# Patient Record
Sex: Male | Born: 1975
Health system: Southern US, Community
[De-identification: ages and names within clinical notes are randomized; demographics above are authoritative.]

## PROBLEM LIST (undated history)

## (undated) HISTORY — PX: KNEE CARTILAGE SURGERY: SHX688

## (undated) HISTORY — PX: APPENDECTOMY: SHX54

## (undated) HISTORY — PX: THUMB FUSION: SUR636

---

## 1998-06-14 ENCOUNTER — Encounter: Payer: Self-pay | Admitting: Emergency Medicine

## 1998-06-14 ENCOUNTER — Emergency Department (HOSPITAL_COMMUNITY): Admission: EM | Admit: 1998-06-14 | Discharge: 1998-06-15 | Payer: Self-pay | Admitting: Emergency Medicine

## 1998-06-15 ENCOUNTER — Encounter: Payer: Self-pay | Admitting: Emergency Medicine

## 1999-01-20 ENCOUNTER — Emergency Department (HOSPITAL_COMMUNITY): Admission: EM | Admit: 1999-01-20 | Discharge: 1999-01-20 | Payer: Self-pay | Admitting: *Deleted

## 2013-06-22 ENCOUNTER — Other Ambulatory Visit: Payer: Self-pay | Admitting: Occupational Medicine

## 2013-06-22 ENCOUNTER — Ambulatory Visit: Payer: Self-pay

## 2013-06-22 DIAGNOSIS — M79641 Pain in right hand: Secondary | ICD-10-CM

## 2013-06-22 DIAGNOSIS — R52 Pain, unspecified: Secondary | ICD-10-CM

## 2015-09-16 ENCOUNTER — Other Ambulatory Visit: Payer: Self-pay | Admitting: Orthopedic Surgery

## 2015-09-16 DIAGNOSIS — R52 Pain, unspecified: Secondary | ICD-10-CM

## 2015-09-25 ENCOUNTER — Ambulatory Visit
Admission: RE | Admit: 2015-09-25 | Discharge: 2015-09-25 | Disposition: A | Discharge status: Home / Self Care | Payer: Worker's Compensation | Source: Ambulatory Visit | Attending: Orthopedic Surgery | Admitting: Orthopedic Surgery

## 2015-09-25 DIAGNOSIS — R52 Pain, unspecified: Secondary | ICD-10-CM

## 2015-09-25 MED ORDER — IOPAMIDOL (ISOVUE-M 200) INJECTION 41%
1.5000 mL | Freq: Once | INTRAMUSCULAR | Status: AC
  Administered 2015-09-25: 1.5 mL via INTRA_ARTICULAR

## 2015-10-07 ENCOUNTER — Other Ambulatory Visit: Payer: Self-pay | Admitting: Orthopedic Surgery

## 2015-10-07 DIAGNOSIS — M79644 Pain in right finger(s): Secondary | ICD-10-CM

## 2015-10-14 ENCOUNTER — Ambulatory Visit
Admission: RE | Admit: 2015-10-14 | Discharge: 2015-10-14 | Disposition: A | Discharge status: Home / Self Care | Payer: Worker's Compensation | Source: Ambulatory Visit | Attending: Orthopedic Surgery | Admitting: Orthopedic Surgery

## 2015-10-14 DIAGNOSIS — M79644 Pain in right finger(s): Secondary | ICD-10-CM

## 2015-10-14 MED ORDER — IOPAMIDOL (ISOVUE-M 200) INJECTION 41%
1.0000 mL | Freq: Once | INTRAMUSCULAR | Status: AC
  Administered 2015-10-14: 1 mL via INTRA_ARTICULAR

## 2016-02-27 ENCOUNTER — Other Ambulatory Visit (HOSPITAL_COMMUNITY): Payer: Self-pay | Admitting: Orthopedic Surgery

## 2016-02-27 DIAGNOSIS — M189 Osteoarthritis of first carpometacarpal joint, unspecified: Secondary | ICD-10-CM

## 2016-03-09 ENCOUNTER — Ambulatory Visit (HOSPITAL_COMMUNITY)
Admission: RE | Admit: 2016-03-09 | Discharge: 2016-03-09 | Disposition: A | Discharge status: Home / Self Care | Payer: Worker's Compensation | Source: Ambulatory Visit | Attending: Orthopedic Surgery | Admitting: Orthopedic Surgery

## 2016-03-09 ENCOUNTER — Encounter (HOSPITAL_COMMUNITY)
Admission: RE | Admit: 2016-03-09 | Discharge: 2016-03-09 | Disposition: A | Discharge status: Home / Self Care | Payer: Worker's Compensation | Source: Ambulatory Visit | Attending: Orthopedic Surgery | Admitting: Orthopedic Surgery

## 2016-03-09 DIAGNOSIS — M189 Osteoarthritis of first carpometacarpal joint, unspecified: Secondary | ICD-10-CM

## 2016-03-09 MED ORDER — TECHNETIUM TC 99M MEDRONATE IV KIT
20.2000 | PACK | Freq: Once | INTRAVENOUS | Status: AC | PRN
  Administered 2016-03-09: 20.2 via INTRAVENOUS

## 2016-10-27 ENCOUNTER — Emergency Department (HOSPITAL_COMMUNITY): Payer: 59

## 2016-10-27 ENCOUNTER — Emergency Department (HOSPITAL_COMMUNITY)
Admission: EM | Admit: 2016-10-27 | Discharge: 2016-10-27 | Disposition: A | Discharge status: Home / Self Care | Payer: 59 | Attending: Emergency Medicine | Admitting: Emergency Medicine

## 2016-10-27 DIAGNOSIS — R079 Chest pain, unspecified: Secondary | ICD-10-CM | POA: Diagnosis not present

## 2016-10-27 DIAGNOSIS — R0789 Other chest pain: Secondary | ICD-10-CM | POA: Diagnosis not present

## 2016-10-27 LAB — BASIC METABOLIC PANEL
ANION GAP: 8 (ref 5–15)
BUN: 15 mg/dL (ref 6–20)
CALCIUM: 9.6 mg/dL (ref 8.9–10.3)
CO2: 24 mmol/L (ref 22–32)
Chloride: 108 mmol/L (ref 101–111)
Creatinine, Ser: 1.47 mg/dL — ABNORMAL HIGH (ref 0.61–1.24)
GFR calc Af Amer: >60 mL/min (ref >60)
GFR, EST NON AFRICAN AMERICAN: 58 mL/min — AB (ref >60)
Glucose, Bld: 93 mg/dL (ref 65–99)
Potassium: 4 mmol/L (ref 3.5–5.1)
SODIUM: 140 mmol/L (ref 135–145)

## 2016-10-27 LAB — CBC
HCT: 47.6 % (ref 39.0–52.0)
Hemoglobin: 15.7 g/dL (ref 13.0–17.0)
MCH: 28.6 pg (ref 26.0–34.0)
MCHC: 33 g/dL (ref 30.0–36.0)
MCV: 86.9 fL (ref 78.0–100.0)
PLATELETS: 248 10*3/uL (ref 150–400)
RBC: 5.48 MIL/uL (ref 4.22–5.81)
RDW: 13.5 % (ref 11.5–15.5)
WBC: 8.4 10*3/uL (ref 4.0–10.5)

## 2016-10-27 LAB — I-STAT TROPONIN, ED
TROPONIN I, POC: 0.02 ng/mL (ref 0.00–0.08)
TROPONIN I, POC: 0.02 ng/mL (ref 0.00–0.08)

## 2016-10-27 MED ORDER — ASPIRIN 81 MG PO CHEW: 324.0000 mg | CHEWABLE_TABLET | Freq: Once | ORAL | Status: DC

## 2016-10-27 NOTE — ED Triage Notes (Signed)
Pt BIB [facility]. Pt reports he was out running today, which he does normally, and had a tight discomfort in his chest. He reports at worst it was 7/10. Already diaphoretic r/t running, deneis SOB, denies NV. Pt states he went home and tried to shower and drink some water but still had discomfort. Went to fire department and called 911. Took 3 Ibuprofen today for dental work yesterday. Given 324 ASA PTA. No Nitro given, pt was pain free for EMS.

## 2016-10-27 NOTE — ED Provider Notes (Signed)
[facility]-EMERGENCY DEPT Provider Note   CSN: [id number] Arrival date & time: 10/27/16  1739     History   Chief Complaint Chief Complaint  Patient presents with  . Chest Pain    HPI Manuel Taylor is a 41 y.o. male.  HPI  Patient is a 41 year old male with no significant past medical history who presents to the emergency department with chest pain. Patient was running approximately 2 miles earlier today which she states that he does on a daily basis. Towards the end of his run he started having substernal chest pressure, nonradiating, dull. No associated shortness of breath, nausea, vomiting, diaphoresis. No syncope or lightheadedness. States he went home, took a shower and rested with only mild improvement of his chest pressure. Worsens when he was swallowing water. Nothing improved. Received aspirin with EMS. States that by the time he got to the emergency department he was no longer having any chest pain. No chronic NSAID or alcohol use. Has been taking ibuprofen over the past couple of days for dental filling. Denies abdominal pain or dysuria. Her tobacco use. No prior history of PE/DVT. No recent long trips or surgery.  No past medical history on file.  There are no active problems to display for this patient.   No past surgical history on file.     Home Medications    Prior to Admission medications   Medication Sig Start Date End Date Taking? Authorizing Provider  ibuprofen (ADVIL,MOTRIN) 200 MG tablet Take 600 mg by mouth every 6 (six) hours as needed for moderate pain.    Yes [provider]    Family History No family history on file.  Social History Social History  Substance Use Topics  . Smoking status: Not on file  . Smokeless tobacco: Not on file  . Alcohol use Not on file     Allergies   Patient has no allergy information on record.   Review of Systems Review of Systems  Constitutional: Negative for appetite change and fever.  HENT: Negative  for congestion.   Eyes: Negative for visual disturbance.  Respiratory: Positive for chest tightness. Negative for shortness of breath.   Cardiovascular: Negative for chest pain and palpitations.  Gastrointestinal: Negative for abdominal pain, blood in stool, nausea and vomiting.  Genitourinary: Negative for decreased urine volume and dysuria.  Musculoskeletal: Negative for back pain.  Skin: Negative for rash.  Neurological: Negative for dizziness, seizures, weakness, light-headedness and headaches.  Psychiatric/Behavioral: Negative for behavioral problems.     Physical Exam Updated Vital Signs BP (!) 142/88   Pulse (!) 52   Temp 98.6 F (37 C) (Oral)   Resp 14   SpO2 97%   Physical Exam  Constitutional: He is oriented to person, place, and time. He appears well-developed and well-nourished.  HENT:  Head: Atraumatic.  Mouth/Throat: Oropharynx is clear and moist.  Eyes: Conjunctivae and EOM are normal.  Neck: Normal range of motion. No JVD present.  Cardiovascular: Normal rate, regular rhythm, normal heart sounds and intact distal pulses.   Pulmonary/Chest: Effort normal and breath sounds normal. No respiratory distress. He has no wheezes. He exhibits no tenderness.  Abdominal: Soft. He exhibits no distension. There is no tenderness.  Musculoskeletal: Normal range of motion.  Neurological: He is alert and oriented to person, place, and time.  Skin: Skin is warm. Capillary refill takes less than 2 seconds.  Psychiatric: He has a normal mood and affect.     ED Treatments / Results  Labs (  all labs ordered are listed, but only abnormal results are displayed) Labs Reviewed  BASIC METABOLIC PANEL - Abnormal; Notable for the following:       Result Value   Creatinine, Ser 1.47 (*)    GFR calc non Af Amer 58 (*)    All other components within normal limits  CBC  CBG MONITORING, ED  I-STAT TROPOININ, ED  I-STAT TROPOININ, ED    EKG  EKG  Interpretation  Date/Time:  Wednesday October 27 2016 18:03:26 EDT Ventricular Rate:  58 PR Interval:    QRS Duration: 109 QT Interval:  456 QTC Calculation: 448 R Axis:   47 Text Interpretation:  Sinus rhythm Borderline T abnormalities, lateral leads Minimal ST elevation, anterior leads No old tracing to compare Confirmed by Melene PlanFloyd, Dan [phone]([id number]) on 10/27/2016 6:12:35 PM       Radiology Dg Chest 2 View  Result Date: 10/27/2016 CLINICAL DATA:  41 year old male with chest pain which began after running earlier today EXAM: CHEST  2 VIEW COMPARISON:  None. FINDINGS: The lungs are clear and negative for focal airspace consolidation, pulmonary edema or suspicious pulmonary nodule. No pleural effusion or pneumothorax. Cardiac and mediastinal contours are within normal limits. No acute fracture or lytic or blastic osseous lesions. The visualized upper abdominal bowel gas pattern is unremarkable. IMPRESSION: Normal chest x-ray. Electronically Signed   By: Malachy MoanHeath  McCullough M.D.   On: 10/27/2016 18:51    Procedures Procedures (including critical care time)  Medications Ordered in ED Medications  aspirin chewable tablet 324 mg (324 mg Oral Not Given 10/27/16 1810)     Initial Impression / Assessment and Plan / ED Course  I have reviewed the triage vital signs and the nursing notes.  Pertinent labs & imaging results that were available during my care of the patient were reviewed by me and considered in my medical decision making (see chart for details).     41 year old with no significant past medical history who presents with chest pain. Resolved prior to arrival. Aspirin 325 with EMS. Afebrile, hemodynamically stable. Lungs clear to auscultation, no murmur, symmetric pulses, benign abdominal exam.  EKG Showed nonspecific T-wave changes of the lateral leads, normal intervals, normal sinus rhythm, no signs of chamber enlargement. No prior EKG to compare. Chest x-ray showed no acute findings. No  significant electrolyte abnormalities. No leukocytosis.  Doubt ACS, low risk HEART score, delta troponin negative. Doubt PE, PERC negative. Doubt pericarditis, currently chest pain-free, not positional, no findings on EKG. Possibly gastritis/PUD. Patient without any signs or symptoms of perforation. Remains chest pain-free while in the emergency department.  Stable for discharge home. Discussed no strenuous exercise until is able to follow up with his primary care physician. Given strict return impressions to the emergency department. Patient expressed understanding, no questions or concerns at time of discharge.  Final Clinical Impressions(s) / ED Diagnoses   Final diagnoses:  Chest pain, unspecified type    New Prescriptions Discharge Medication List as of 10/27/2016  9:24 PM       Corena HerterMumma, Marine Lezotte, MD 10/27/16 2256    Melene PlanFloyd, Dan, DO 10/27/16 2306

## 2016-10-27 NOTE — ED Notes (Signed)
Patient transported to X-ray 

## 2016-11-19 DIAGNOSIS — Z13220 Encounter for screening for lipoid disorders: Secondary | ICD-10-CM | POA: Diagnosis not present

## 2016-11-19 DIAGNOSIS — Z0000 Encounter for general adult medical examination without abnormal findings: Secondary | ICD-10-CM | POA: Diagnosis not present

## 2017-01-07 IMAGING — XA DG FLUORO GUIDE NDL PLC/BX
1 series · 1 of 1 positions shown · non-contrast
Comparison: none

CLINICAL DATA: RIGHT hand pain. RIGHT thumb pain. Megumi
compensation injury.

[Series 2: ortho extremity · 1 of 1 slices shown]
[im 1/1]
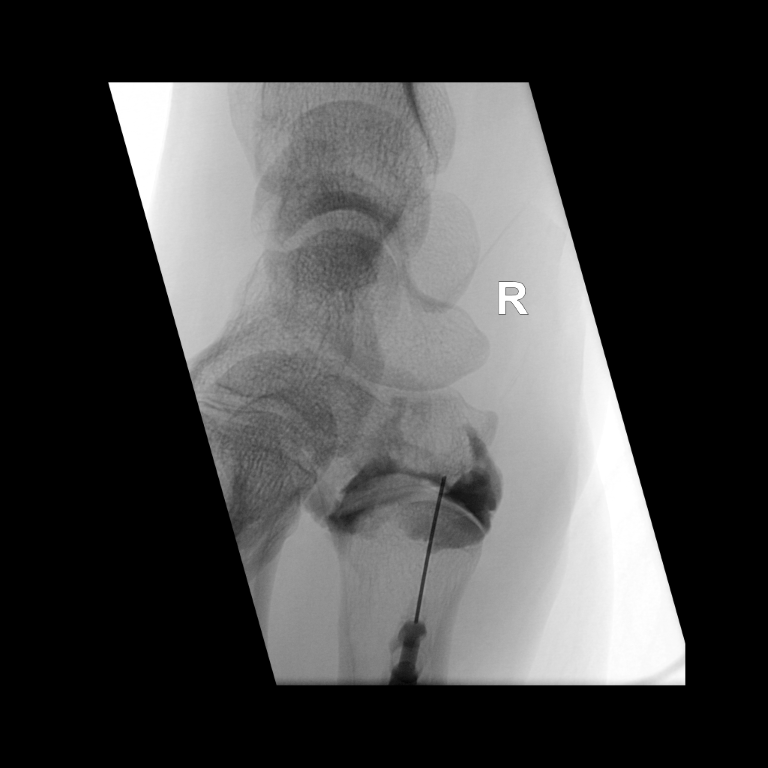

[1 of 1 positions shown; findings below may reference images not displayed]

FLUOROSCOPY TIME:  54 seconds corresponding to a Dose Area Product
of 6.91 ?Gy*m2

PROCEDURE:
RIGHT FIRST CARPOMETACARPAL JOINT  INJECTION UNDER FLUOROSCOPY

The patient was here 3 weeks ago for MR arthrography, and the MCP
joint instead of the CMC joint was injected. Today's study is a
follow-up to that encounter.

An appropriate skin entrance site was determined. The site was
marked, prepped with Betadine, draped in the usual sterile fashion,
and infiltrated locally with 1% lidocaine. 25 gauge hypodermic
needle was placed in the joint from a dorsal lateral approach A
mixture of 0.1 ml Multihance and 20 ml of dilute Isovue M 200 was
used to opacify the joint with 1 mL of contrast mixture. No
immediate complication.
IMPRESSION: Technically successful RIGHT first CMC joint injection for MR
arthrography.

## 2017-11-21 DIAGNOSIS — Z8042 Family history of malignant neoplasm of prostate: Secondary | ICD-10-CM | POA: Diagnosis not present

## 2017-11-21 DIAGNOSIS — Z13220 Encounter for screening for lipoid disorders: Secondary | ICD-10-CM | POA: Diagnosis not present

## 2017-11-21 DIAGNOSIS — Z0000 Encounter for general adult medical examination without abnormal findings: Secondary | ICD-10-CM | POA: Diagnosis not present

## 2018-02-23 DIAGNOSIS — R7309 Other abnormal glucose: Secondary | ICD-10-CM | POA: Diagnosis not present

## 2018-08-07 ENCOUNTER — Emergency Department (HOSPITAL_COMMUNITY): Payer: 59

## 2018-08-07 ENCOUNTER — Emergency Department (HOSPITAL_COMMUNITY): Payer: 59 | Admitting: Certified Registered"

## 2018-08-07 ENCOUNTER — Encounter (HOSPITAL_COMMUNITY)
Admission: EM | Disposition: A | Discharge status: Home / Self Care | Payer: Self-pay | Source: Home / Self Care | Attending: Emergency Medicine

## 2018-08-07 ENCOUNTER — Other Ambulatory Visit: Payer: Self-pay

## 2018-08-07 ENCOUNTER — Ambulatory Visit (HOSPITAL_COMMUNITY)
Admission: EM | Admit: 2018-08-07 | Discharge: 2018-08-07 | Disposition: A | Discharge status: Home / Self Care | Payer: 59 | Attending: Emergency Medicine | Admitting: Emergency Medicine

## 2018-08-07 ENCOUNTER — Ambulatory Visit: Admit: 2018-08-07 | Payer: 59 | Admitting: General Surgery

## 2018-08-07 ENCOUNTER — Encounter (HOSPITAL_COMMUNITY): Payer: Self-pay | Admitting: *Deleted

## 2018-08-07 DIAGNOSIS — W3189XA Contact with other specified machinery, initial encounter: Secondary | ICD-10-CM | POA: Insufficient documentation

## 2018-08-07 DIAGNOSIS — S6992XA Unspecified injury of left wrist, hand and finger(s), initial encounter: Secondary | ICD-10-CM | POA: Diagnosis present

## 2018-08-07 DIAGNOSIS — S68623A Partial traumatic transphalangeal amputation of left middle finger, initial encounter: Secondary | ICD-10-CM | POA: Diagnosis not present

## 2018-08-07 DIAGNOSIS — Z87891 Personal history of nicotine dependence: Secondary | ICD-10-CM | POA: Diagnosis not present

## 2018-08-07 DIAGNOSIS — S68119A Complete traumatic metacarpophalangeal amputation of unspecified finger, initial encounter: Secondary | ICD-10-CM

## 2018-08-07 HISTORY — PX: AMPUTATION: SHX166

## 2018-08-07 LAB — BASIC METABOLIC PANEL
Anion gap: 8 (ref 5–15)
BUN: 12 mg/dL (ref 6–20)
CO2: 25 mmol/L (ref 22–32)
Calcium: 9.6 mg/dL (ref 8.9–10.3)
Chloride: 110 mmol/L (ref 98–111)
Creatinine, Ser: 1.1 mg/dL (ref 0.61–1.24)
GFR calc Af Amer: >60 mL/min (ref >60)
GFR calc non Af Amer: >60 mL/min (ref >60)
Glucose, Bld: 110 mg/dL — ABNORMAL HIGH (ref 70–99)
Potassium: 4.3 mmol/L (ref 3.5–5.1)
Sodium: 143 mmol/L (ref 135–145)

## 2018-08-07 LAB — CBC WITH DIFFERENTIAL/PLATELET
Abs Immature Granulocytes: 0 10*3/uL (ref 0.00–0.07)
Basophils Absolute: 0 10*3/uL (ref 0.0–0.1)
Basophils Relative: 1 %
Eosinophils Absolute: 0.1 10*3/uL (ref 0.0–0.5)
Eosinophils Relative: 1 %
HCT: 51.2 % (ref 39.0–52.0)
Hemoglobin: 16.3 g/dL (ref 13.0–17.0)
Immature Granulocytes: 0 %
Lymphocytes Relative: 39 %
Lymphs Abs: 1.9 10*3/uL (ref 0.7–4.0)
MCH: 28.4 pg (ref 26.0–34.0)
MCHC: 31.8 g/dL (ref 30.0–36.0)
MCV: 89.2 fL (ref 80.0–100.0)
Monocytes Absolute: 0.3 10*3/uL (ref 0.1–1.0)
Monocytes Relative: 7 %
Neutro Abs: 2.6 10*3/uL (ref 1.7–7.7)
Neutrophils Relative %: 52 %
Platelets: 222 10*3/uL (ref 150–400)
RBC: 5.74 MIL/uL (ref 4.22–5.81)
RDW: 12.4 % (ref 11.5–15.5)
WBC: 5 10*3/uL (ref 4.0–10.5)
nRBC: 0 % (ref 0.0–0.2)

## 2018-08-07 LAB — PROTIME-INR
INR: 1.2 (ref 0.8–1.2)
Prothrombin Time: 15.1 seconds (ref 11.4–15.2)

## 2018-08-07 SURGERY — AMPUTATION DIGIT
Anesthesia: Monitor Anesthesia Care | Laterality: Left

## 2018-08-07 MED ORDER — CEFAZOLIN SODIUM-DEXTROSE 1-4 GM/50ML-% IV SOLN
1.0000 g | Freq: Once | INTRAVENOUS | Status: AC
  Administered 2018-08-07: 1 g via INTRAVENOUS
  Filled 2018-08-07: qty 50

## 2018-08-07 MED ORDER — 0.9 % SODIUM CHLORIDE (POUR BTL) OPTIME
TOPICAL | Status: DC | PRN
  Administered 2018-08-07: 1000 mL

## 2018-08-07 MED ORDER — OXYCODONE HCL 5 MG/5ML PO SOLN: 5.0000 mg | Freq: Once | ORAL | Status: AC | PRN

## 2018-08-07 MED ORDER — MIDAZOLAM HCL 2 MG/2ML IJ SOLN
INTRAMUSCULAR | Status: AC
  Filled 2018-08-07: qty 2

## 2018-08-07 MED ORDER — SODIUM CHLORIDE 0.9 % IV BOLUS
500.0000 mL | Freq: Once | INTRAVENOUS | Status: DC
Start: 2018-08-07 — End: 2018-08-07

## 2018-08-07 MED ORDER — BUPIVACAINE HCL (PF) 0.25 % IJ SOLN
INTRAMUSCULAR | Status: DC | PRN
  Administered 2018-08-07: 10 mL

## 2018-08-07 MED ORDER — LIDOCAINE 2% (20 MG/ML) 5 ML SYRINGE
INTRAMUSCULAR | Status: AC
  Filled 2018-08-07: qty 5

## 2018-08-07 MED ORDER — CEPHALEXIN 500 MG PO CAPS: 500.0000 mg | ORAL_CAPSULE | Freq: Four times a day (QID) | ORAL | 0 refills | Status: DC

## 2018-08-07 MED ORDER — HYDROCODONE-ACETAMINOPHEN 5-325 MG PO TABS: 1.0000 | ORAL_TABLET | ORAL | 0 refills | Status: AC | PRN

## 2018-08-07 MED ORDER — ONDANSETRON HCL 4 MG/2ML IJ SOLN: 4.0000 mg | Freq: Four times a day (QID) | INTRAMUSCULAR | Status: DC | PRN

## 2018-08-07 MED ORDER — CHLORHEXIDINE GLUCONATE 4 % EX LIQD
60.0000 mL | Freq: Once | CUTANEOUS | Status: DC
  Filled 2018-08-07: qty 60

## 2018-08-07 MED ORDER — ONDANSETRON HCL 4 MG/2ML IJ SOLN
INTRAMUSCULAR | Status: DC | PRN
  Administered 2018-08-07: 4 mg via INTRAVENOUS

## 2018-08-07 MED ORDER — MIDAZOLAM HCL 5 MG/5ML IJ SOLN
INTRAMUSCULAR | Status: DC | PRN
  Administered 2018-08-07: 2 mg via INTRAVENOUS

## 2018-08-07 MED ORDER — OXYCODONE HCL 5 MG PO TABS
5.0000 mg | ORAL_TABLET | Freq: Once | ORAL | Status: AC | PRN
  Administered 2018-08-07: 5 mg via ORAL

## 2018-08-07 MED ORDER — BUPIVACAINE HCL (PF) 0.25 % IJ SOLN
INTRAMUSCULAR | Status: AC
  Filled 2018-08-07: qty 30

## 2018-08-07 MED ORDER — PROPOFOL 500 MG/50ML IV EMUL
INTRAVENOUS | Status: DC | PRN
  Administered 2018-08-07: 100 ug/kg/min via INTRAVENOUS

## 2018-08-07 MED ORDER — BUPIVACAINE HCL (PF) 0.5 % IJ SOLN
10.0000 mL | Freq: Once | INTRAMUSCULAR | Status: AC
  Administered 2018-08-07: 10 mL
  Filled 2018-08-07: qty 10

## 2018-08-07 MED ORDER — LACTATED RINGERS IV SOLN
INTRAVENOUS | Status: DC
  Administered 2018-08-07: 17:00:00 via INTRAVENOUS

## 2018-08-07 MED ORDER — FENTANYL CITRATE (PF) 100 MCG/2ML IJ SOLN: 25.0000 ug | INTRAMUSCULAR | Status: DC | PRN

## 2018-08-07 MED ORDER — OXYCODONE HCL 5 MG PO TABS
ORAL_TABLET | ORAL | Status: AC
  Filled 2018-08-07: qty 1

## 2018-08-07 MED ORDER — LIDOCAINE 2% (20 MG/ML) 5 ML SYRINGE
INTRAMUSCULAR | Status: DC | PRN
  Administered 2018-08-07: 60 mg via INTRAVENOUS

## 2018-08-07 MED ORDER — FENTANYL CITRATE (PF) 250 MCG/5ML IJ SOLN
INTRAMUSCULAR | Status: AC
  Filled 2018-08-07: qty 5

## 2018-08-07 MED ORDER — DEXAMETHASONE SODIUM PHOSPHATE 10 MG/ML IJ SOLN
INTRAMUSCULAR | Status: DC | PRN
  Administered 2018-08-07: 4 mg via INTRAVENOUS

## 2018-08-07 MED ORDER — FENTANYL CITRATE (PF) 250 MCG/5ML IJ SOLN
INTRAMUSCULAR | Status: DC | PRN
  Administered 2018-08-07: 100 ug via INTRAVENOUS

## 2018-08-07 MED ORDER — POVIDONE-IODINE 10 % EX SWAB: 2.0000 "application " | Freq: Once | CUTANEOUS | Status: DC

## 2018-08-07 SURGICAL SUPPLY — 35 items
BANDAGE ACE 4X5 VEL STRL LF (GAUZE/BANDAGES/DRESSINGS) ×3 IMPLANT
BANDAGE COBAN LF 1.5X5 NS (GAUZE/BANDAGES/DRESSINGS) ×2 IMPLANT
BNDG CMPR 9X4 STRL LF SNTH (GAUZE/BANDAGES/DRESSINGS) ×1
BNDG ESMARK 4X9 LF (GAUZE/BANDAGES/DRESSINGS) ×3 IMPLANT
BNDG GAUZE ELAST 4 BULKY (GAUZE/BANDAGES/DRESSINGS) ×3 IMPLANT
CHLORAPREP W/TINT 26ML (MISCELLANEOUS) ×3 IMPLANT
CORDS BIPOLAR (ELECTRODE) ×3 IMPLANT
COVER WAND RF STERILE (DRAPES) ×3 IMPLANT
DRSG ADAPTIC 3X8 NADH LF (GAUZE/BANDAGES/DRESSINGS) ×3 IMPLANT
DRSG XEROFORM 1X8 (GAUZE/BANDAGES/DRESSINGS) ×2 IMPLANT
ELECT REM PT RETURN 9FT ADLT (ELECTROSURGICAL)
ELECTRODE REM PT RTRN 9FT ADLT (ELECTROSURGICAL) IMPLANT
GAUZE SPONGE 4X4 12PLY STRL (GAUZE/BANDAGES/DRESSINGS) ×3 IMPLANT
GAUZE XEROFORM 1X8 LF (GAUZE/BANDAGES/DRESSINGS) ×3 IMPLANT
GLOVE BIOGEL M 8.0 STRL (GLOVE) ×3 IMPLANT
GOWN STRL REUS W/ TWL LRG LVL3 (GOWN DISPOSABLE) ×1 IMPLANT
GOWN STRL REUS W/ TWL XL LVL3 (GOWN DISPOSABLE) ×1 IMPLANT
GOWN STRL REUS W/TWL LRG LVL3 (GOWN DISPOSABLE) ×3
GOWN STRL REUS W/TWL XL LVL3 (GOWN DISPOSABLE) ×3
KIT BASIN OR (CUSTOM PROCEDURE TRAY) ×3 IMPLANT
KIT TURNOVER KIT B (KITS) ×3 IMPLANT
NS IRRIG 1000ML POUR BTL (IV SOLUTION) ×3 IMPLANT
PACK ORTHO EXTREMITY (CUSTOM PROCEDURE TRAY) ×3 IMPLANT
PAD ARMBOARD 7.5X6 YLW CONV (MISCELLANEOUS) ×6 IMPLANT
PAD CAST 4YDX4 CTTN HI CHSV (CAST SUPPLIES) ×1 IMPLANT
PADDING CAST COTTON 4X4 STRL (CAST SUPPLIES) ×3
SOAP 2 % CHG 4 OZ (WOUND CARE) ×3 IMPLANT
SPONGE LAP 18X18 RF (DISPOSABLE) ×3 IMPLANT
SPONGE LAP 4X18 RFD (DISPOSABLE) ×3 IMPLANT
TOWEL OR 17X24 6PK STRL BLUE (TOWEL DISPOSABLE) ×3 IMPLANT
TOWEL OR 17X26 10 PK STRL BLUE (TOWEL DISPOSABLE) ×3 IMPLANT
TUBE CONNECTING 12'X1/4 (SUCTIONS)
TUBE CONNECTING 12X1/4 (SUCTIONS) IMPLANT
WATER STERILE IRR 1000ML POUR (IV SOLUTION) ×3 IMPLANT
YANKAUER SUCT BULB TIP NO VENT (SUCTIONS) IMPLANT

## 2018-08-07 NOTE — ED Provider Notes (Signed)
Emergency Department Provider Note   I have reviewed the triage vital signs and the nursing notes.   HISTORY  Chief Complaint Finger Injury   HPI Manuel Taylor is a 43 y.o. male presents to the emergency department for evaluation of left middle finger injury.  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  Patient is right-hand dominant.  He describes a constant, throbbing, mild pain at this time.  He believes he is had his tetanus updated within the last 5 years.  He denies any other injury.  He was able to hold pressure on the wound and stop bleeding.  No radiation of symptoms or other modifying factors.  History reviewed. No pertinent past medical history.  There are no active problems to display for this patient.   History reviewed. No pertinent surgical history.  Allergies Patient has no allergy information on record.  History reviewed. No pertinent family history.  Social History Social History   Tobacco Use   Smoking status: Former Smoker   Smokeless tobacco: Never Used  Substance Use Topics   Alcohol use: Not on file    Comment: social   Drug use: Never    Review of Systems  Constitutional: No fever/chills Eyes: No visual changes. ENT: No sore throat. Cardiovascular: Denies chest pain. Respiratory: Denies shortness of breath. Gastrointestinal: No abdominal pain.  No nausea, no vomiting.  No diarrhea.  No constipation. Genitourinary: Negative for dysuria. Musculoskeletal: Left middle finger bleeding.  Skin: Finger injury with bleeding.  Neurological: Negative for headaches, focal weakness or numbness.  10-point ROS otherwise negative.  ____________________________________________   PHYSICAL EXAM:  VITAL SIGNS: ED Triage Vitals  Enc Vitals Group     BP 08/07/18 1448 (!) 142/107     Pulse Rate 08/07/18 1448 (!) 53     Resp 08/07/18 1448 20     Temp 08/07/18  1448 98 F (36.7 C)     Temp Source 08/07/18 1448 Oral     SpO2 08/07/18 1448 98 %     Weight 08/07/18 1504 219 lb (99.3 kg)     Height 08/07/18 1504 5\' 10"  (1.778 m)     Pain Score 08/07/18 1503 3   Constitutional: Alert and oriented. Well appearing and in no acute distress. Eyes: Conjunctivae are normal.  Head: Atraumatic. Nose: No congestion/rhinnorhea. Mouth/Throat: Mucous membranes are moist.  Neck: No stridor.   Cardiovascular: Normal rate, regular rhythm.  Respiratory: Normal respiratory effort. Gastrointestinal: No distention.  Musculoskeletal: Distal finger tip injury pictured below. Normal ROM of the finger.  Neurologic:  Normal speech and language. No gross focal neurologic deficits are appreciated.  Skin:  Skin is warm and dry. Avulsion injury to the left middle finger as pictured below.      ____________________________________________   LABS (all labs ordered are listed, but only abnormal results are displayed)  Labs Reviewed  BASIC METABOLIC PANEL - Abnormal; Notable for the following components:      Result Value   Glucose, Bld 110 (*)    All other components within normal limits  CBC WITH DIFFERENTIAL/PLATELET  PROTIME-INR   ____________________________________________  RADIOLOGY  Dg Finger Middle Left  Result Date: 08/07/2018 CLINICAL DATA:  Finger caught between aeration machine and tree EXAM: LEFT THIRD FINGER 2+V COMPARISON:  None. FINDINGS: Frontal, oblique, and lateral views obtained. There is amputation of the distal aspect of the third distal digit. There is a  fracture of the distal most aspect of the third distal phalanx with alignment near anatomic. No other fracture. No dislocation. Joint spaces appear normal. No erosive change. IMPRESSION: Soft tissue amputation distally with fracture of the distal most aspect of the third distal phalanx in near anatomic alignment. No other fracture. No dislocation. No appreciable arthropathy. Electronically  Signed   By: Bretta Bang III M.D.   On: 08/07/2018 16:26    ____________________________________________   PROCEDURES  Procedure(s) performed:   Procedures  None  ____________________________________________   INITIAL IMPRESSION / ASSESSMENT AND PLAN / ED COURSE  Pertinent labs & imaging results that were available during my care of the patient were reviewed by me and considered in my medical decision making (see chart for details).   Patient presents to the emergency department with left distal finger injury.  Patient was seen by Charma Igo, PA-C with ortho. Patient will need revision in the OR. X-ray pending. Patient given ANcef and ordered pre-op labs. Patient reports tetanus given in the last 5 years.   X-ray and lab testing reviewed.  Patient to go to the OR with hand surgery.  I reviewed all nursing notes, vitals, pertinent old records, EKGs, labs, imaging (as available).  ____________________________________________  FINAL CLINICAL IMPRESSION(S) / ED DIAGNOSES  Final diagnoses:  Injury of finger of left hand, initial encounter     MEDICATIONS GIVEN DURING THIS VISIT:  Medications  sodium chloride 0.9 % bolus 500 mL ( Intravenous MAR Hold 08/07/18 1633)  chlorhexidine (HIBICLENS) 4 % liquid 4 application (has no administration in time range)  povidone-iodine 10 % swab 2 application (has no administration in time range)  lactated ringers infusion (has no administration in time range)  ceFAZolin (ANCEF) IVPB 1 g/50 mL premix (0 g Intravenous Stopped 08/07/18 1601)  bupivacaine (MARCAINE) 0.5 % injection 10 mL (10 mLs Infiltration Given 08/07/18 1540)    Note:  This document was prepared using Dragon voice recognition software and may include unintentional dictation errors.  Alona Bene, MD Emergency Medicine    Alfreda Hammad, Arlyss Repress, MD 08/07/18 [phone]

## 2018-08-07 NOTE — ED Triage Notes (Signed)
Pt has injury to Lt middle finger. Pt injured  While using an airator.

## 2018-08-07 NOTE — Transfer of Care (Signed)
Immediate Anesthesia Transfer of Care Note  Patient: Manuel Taylor  Procedure(s) Performed: AMPUTATION DIGIT (Left )  Patient Location: PACU  Anesthesia Type:MAC  Level of Consciousness: awake, alert , oriented and patient cooperative  Airway & Oxygen Therapy: Patient Spontanous Breathing and Patient connected to nasal cannula oxygen  Post-op Assessment: Report given to RN, Post -op Vital signs reviewed and stable and Patient moving all extremities  Post vital signs: Reviewed and stable  Last Vitals:  Vitals Value Taken Time  BP 135/92 08/07/2018  5:55 PM  Temp    Pulse 46 08/07/2018  5:56 PM  Resp 9 08/07/2018  5:56 PM  SpO2 98 % 08/07/2018  5:56 PM  Vitals shown include unvalidated device data.  Last Pain:  Vitals:   08/07/18 1626  TempSrc:   PainSc: 0-No pain      Patients Stated Pain Goal: 0 (24/23/53 6144)  Complications: No apparent anesthesia complications

## 2018-08-07 NOTE — Anesthesia Preprocedure Evaluation (Signed)
Anesthesia Evaluation  Patient identified by MRN, date of birth, ID band Patient awake    Reviewed: Allergy & Precautions, H&P , NPO status , Patient's Chart, lab work & pertinent test results  Airway Mallampati: II   Neck ROM: full    Dental   Pulmonary former smoker,    breath sounds clear to auscultation       Cardiovascular negative cardio ROS   Rhythm:regular Rate:Normal     Neuro/Psych    GI/Hepatic   Endo/Other  obese  Renal/GU      Musculoskeletal   Abdominal   Peds  Hematology   Anesthesia Other Findings   Reproductive/Obstetrics                             Anesthesia Physical Anesthesia Plan  ASA: I  Anesthesia Plan: MAC   Post-op Pain Management:    Induction: Intravenous  PONV Risk Score and Plan: 1 and Propofol infusion, Treatment may vary due to age or medical condition and Ondansetron  Airway Management Planned: Simple Face Mask  Additional Equipment:   Intra-op Plan:   Post-operative Plan:   Informed Consent: I have reviewed the patients History and Physical, chart, labs and discussed the procedure including the risks, benefits and alternatives for the proposed anesthesia with the patient or authorized representative who has indicated his/her understanding and acceptance.       Plan Discussed with: CRNA, Anesthesiologist and Surgeon  Anesthesia Plan Comments:         Anesthesia Quick Evaluation

## 2018-08-07 NOTE — Op Note (Signed)
NAME: Manuel Taylor, Manuel Taylor MEDICAL RECORD AY:[id number] ACCOUNT [id number] DATE OF BIRTH:July 03, 1975 FACILITY: [facility] LOCATION: [facility] PHYSICIAN:Janashia Parco Harle Battiest, MD  OPERATIVE REPORT  DATE OF PROCEDURE:  08/07/2018  PREOPERATIVE DIAGNOSIS:  Partial amputation of the tip of the left long finger.  POSTOPERATIVE DIAGNOSIS:  Partial amputation of the tip of the left long finger.  PROCEDURE:  Revision amputation with local advancement flap of the left long finger.  ANESTHESIA:  Local with IV sedation.  COMPLICATIONS:  No acute complications.  INDICATIONS:  The patient is a 43 year old gentleman that was working and got his hand caught in some kind of irrigator-type machine, partially amputating the tip of the left long finger.  He presented to the ER.  I was consulted.  Risks, benefits, and  alternatives of repair versus revision amputation were discussed with the patient.  He agreed with this.  Consent was obtained.  DESCRIPTION OF PROCEDURE:  The patient was taken to the operating room and placed supine on the operating table.  Timeout was performed.  Preoperative antibiotics were given.  The left upper extremity was prepped and draped in normal sterile fashion.   The finger was evaluated.  There was a large tissue defect with a bone, soft tissue, and nail that was missing.  The skin edges were sharply debrided with a knife.  Subcutaneous tissue was debrided.  The nail plate was partially removed.  The nail bed  was debrided as well as the distal portion of the distal phalanx.  Next, a skin flap was fashioned to allow rotation over the tip of the bone and cover the defect nicely.  This was performed and sutured in place with multiple interrupted 5-0 nylon  sutures.  Postoperative appearance looked satisfactory.  A sterile dressing was then placed.  Marcaine was infiltrated around the base of the finger for postoperative pain control.  The patient tolerated the procedure well and was taken to  recovery room  in stable condition.  LN/NUANCE  D:08/07/2018 T:08/07/2018 JOB:[id number]

## 2018-08-07 NOTE — Consult Note (Signed)
Reason for Consult:Left long finger amputation Referring Physician: Mariel Aloe  Manuel Taylor is an 43 y.o. male.  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 It took the tip of it off. He came to the ED for evaluation and hand surgery was consulted. He is RHD.  History reviewed. No pertinent past medical history.  History reviewed. No pertinent surgical history.  History reviewed. No pertinent family history.  Social History:  reports that he has quit smoking. He has never used smokeless tobacco. He reports that he does not use drugs. No history on file for alcohol.  Allergies: Not on File  Medications: I have reviewed the patient's current medications.  No results found for this or any previous visit (from the past 48 hour(s)).  No results found.  Review of Systems  Constitutional: Negative for weight loss.  HENT: Negative for ear discharge, ear pain, hearing loss and tinnitus.   Eyes: Negative for blurred vision, double vision, photophobia and pain.  Respiratory: Negative for cough, sputum production and shortness of breath.   Cardiovascular: Negative for chest pain.  Gastrointestinal: Negative for abdominal pain, nausea and vomiting.  Genitourinary: Negative for dysuria, flank pain, frequency and urgency.  Musculoskeletal: Positive for joint pain (Left middle finger). Negative for back pain, falls, myalgias and neck pain.  Neurological: Negative for dizziness, tingling, sensory change, focal weakness, loss of consciousness and headaches.  Endo/Heme/Allergies: Does not bruise/bleed easily.  Psychiatric/Behavioral: Negative for depression, memory loss and substance abuse. The patient is not nervous/anxious.    Blood pressure (!) 142/107, pulse (!) 53, temperature 98 F (36.7 C), temperature source Oral, resp. rate 20, height 5\' 10"  (1.778 m), weight 99.3 kg, SpO2 98 %. Physical Exam  Constitutional: He appears well-developed  and well-nourished. No distress.  HENT:  Head: Normocephalic and atraumatic.  Eyes: Conjunctivae are normal. Right eye exhibits no discharge. Left eye exhibits no discharge. No scleral icterus.  Neck: Normal range of motion.  Cardiovascular: Normal rate and regular rhythm.  Respiratory: Effort normal. No respiratory distress.  Musculoskeletal:     Comments: Left shoulder, elbow, wrist, digits- Long finger tip amputation with further tissue loss subungually, mod TTP, no instability, no blocks to motion  Sens  Ax/R/M/U intact  Mot   Ax/ R/ PIN/ M/ AIN/ U intact  Rad 2+  Neurological: He is alert.  Skin: Skin is warm and dry. He is not diaphoretic.  Psychiatric: He has a normal mood and affect. His behavior is normal.    Assessment/Plan: Left long finger amputation -- To OR for revision amputation by Dr. Izora Ribas. NPO until then.     Freeman Caldron, PA-C Orthopedic Surgery [phone] 08/07/2018, 3:13 PM

## 2018-08-08 ENCOUNTER — Encounter (HOSPITAL_COMMUNITY): Payer: Self-pay | Admitting: General Surgery

## 2018-08-09 NOTE — Anesthesia Postprocedure Evaluation (Signed)
Anesthesia Post Note  Patient: Manuel Taylor  Procedure(s) Performed: AMPUTATION DIGIT (Left )     Patient location during evaluation: PACU Anesthesia Type: MAC Level of consciousness: awake and alert Pain management: pain level controlled Vital Signs Assessment: post-procedure vital signs reviewed and stable Respiratory status: spontaneous breathing, nonlabored ventilation, respiratory function stable and patient connected to nasal cannula oxygen Cardiovascular status: stable and blood pressure returned to baseline Postop Assessment: no apparent nausea or vomiting Anesthetic complications: no    Last Vitals:  Vitals:   08/07/18 1810 08/07/18 1825  BP: 133/89 133/88  Pulse: (!) 45 (!) 48  Resp: 11 12  Temp:  (!) 36.4 C  SpO2: 100% 98%    Last Pain:  Vitals:   08/07/18 1825  TempSrc:   PainSc: 0-No pain                 [name] S

## 2018-12-13 ENCOUNTER — Other Ambulatory Visit: Payer: Self-pay

## 2018-12-13 DIAGNOSIS — Z20822 Contact with and (suspected) exposure to covid-19: Secondary | ICD-10-CM

## 2018-12-14 LAB — NOVEL CORONAVIRUS, NAA: SARS-CoV-2, NAA: NOT DETECTED

## 2021-04-15 ENCOUNTER — Emergency Department (HOSPITAL_BASED_OUTPATIENT_CLINIC_OR_DEPARTMENT_OTHER)
Admission: EM | Admit: 2021-04-15 | Discharge: 2021-04-15 | Disposition: A | Discharge status: Home / Self Care | Payer: Worker's Compensation | Attending: Emergency Medicine | Admitting: Emergency Medicine

## 2021-04-15 ENCOUNTER — Emergency Department (HOSPITAL_BASED_OUTPATIENT_CLINIC_OR_DEPARTMENT_OTHER): Payer: Worker's Compensation | Admitting: Radiology

## 2021-04-15 ENCOUNTER — Other Ambulatory Visit: Payer: Self-pay

## 2021-04-15 ENCOUNTER — Encounter (HOSPITAL_BASED_OUTPATIENT_CLINIC_OR_DEPARTMENT_OTHER): Payer: Self-pay | Admitting: Emergency Medicine

## 2021-04-15 DIAGNOSIS — S62660B Nondisplaced fracture of distal phalanx of right index finger, initial encounter for open fracture: Secondary | ICD-10-CM | POA: Insufficient documentation

## 2021-04-15 DIAGNOSIS — S61212A Laceration without foreign body of right middle finger without damage to nail, initial encounter: Secondary | ICD-10-CM | POA: Insufficient documentation

## 2021-04-15 DIAGNOSIS — W28XXXA Contact with powered lawn mower, initial encounter: Secondary | ICD-10-CM | POA: Diagnosis not present

## 2021-04-15 DIAGNOSIS — S6991XA Unspecified injury of right wrist, hand and finger(s), initial encounter: Secondary | ICD-10-CM | POA: Diagnosis present

## 2021-04-15 DIAGNOSIS — Z87891 Personal history of nicotine dependence: Secondary | ICD-10-CM | POA: Insufficient documentation

## 2021-04-15 MED ORDER — LIDOCAINE HCL (PF) 1 % IJ SOLN
5.0000 mL | Freq: Once | INTRAMUSCULAR | Status: AC
  Administered 2021-04-15: 13:00:00 5 mL
  Filled 2021-04-15: qty 5

## 2021-04-15 MED ORDER — CEFAZOLIN SODIUM-DEXTROSE 2-4 GM/100ML-% IV SOLN
2.0000 g | Freq: Once | INTRAVENOUS | Status: AC
  Administered 2021-04-15: 14:00:00 2 g via INTRAVENOUS
  Filled 2021-04-15: qty 100

## 2021-04-15 MED ORDER — CEPHALEXIN 500 MG PO CAPS: 500.0000 mg | ORAL_CAPSULE | Freq: Three times a day (TID) | ORAL | 0 refills | Status: AC

## 2021-04-15 MED ORDER — HYDROCODONE-ACETAMINOPHEN 5-325 MG PO TABS: 1.0000 | ORAL_TABLET | ORAL | 0 refills | Status: DC | PRN

## 2021-04-15 NOTE — ED Provider Notes (Signed)
[facility] EMERGENCY DEPT Provider Note   CSN: [id number] Arrival date & time: 04/15/21  1227     History Chief Complaint  Patient presents with   Finger Injury    Manuel Taylor is a 45 y.o. male.  45 year old male presents for evaluation of right third finger injury.  Patient was working on his lawnmower when he smashed his finger.  Was wearing a glove at the time.  Patient is right-hand dominant.  Not anticoagulated.  Last tetanus was less than 5 years ago.  No other injuries, complaints, concerns.      History reviewed. No pertinent past medical history.  There are no problems to display for this patient.   Past Surgical History:  Procedure Laterality Date   AMPUTATION Left 08/07/2018   Procedure: AMPUTATION DIGIT;  Surgeon: Knute Neu, MD;  Location: [facility] OR;  Service: Plastics;  Laterality: Left;       History reviewed. No pertinent family history.  Social History   Tobacco Use   Smoking status: Former   Smokeless tobacco: Never  Substance Use Topics   Drug use: Never    Home Medications Prior to Admission medications   Medication Sig Start Date End Date Taking? Authorizing Provider  cephALEXin (KEFLEX) 500 MG capsule Take 1 capsule (500 mg total) by mouth 3 (three) times daily for 7 days. 04/15/21 04/22/21 Yes Jeannie Fend, PA-C  HYDROcodone-acetaminophen (NORCO/VICODIN) 5-325 MG tablet Take 1 tablet by mouth every 4 (four) hours as needed. 04/15/21  Yes Jeannie Fend, PA-C  ibuprofen (ADVIL,MOTRIN) 200 MG tablet Take 600 mg by mouth every 6 (six) hours as needed for moderate pain.     [provider]    Allergies    Patient has no known allergies.  Review of Systems   Review of Systems  Constitutional:  Negative for fever.  Musculoskeletal:  Positive for arthralgias, joint swelling and myalgias.  Skin:  Positive for wound.  Allergic/Immunologic: Negative for immunocompromised state.  Neurological:  Negative for  weakness and numbness.  Hematological:  Does not bruise/bleed easily.   Physical Exam Updated Vital Signs BP (!) 164/98    Pulse 61    Temp (!) 96.7 F (35.9 C) (Oral)    Resp 18    SpO2 100%   Physical Exam Vitals and nursing note reviewed.  Constitutional:      General: He is not in acute distress.    Appearance: He is well-developed. He is not diaphoretic.  HENT:     Head: Normocephalic and atraumatic.  Cardiovascular:     Pulses: Normal pulses.  Pulmonary:     Effort: Pulmonary effort is normal.  Musculoskeletal:        General: Swelling, tenderness and signs of injury present.     Comments: Crush injury to the pad of right third finger.  Sensation intact, does not involve the nailbed.  Skin:    General: Skin is warm and dry.     Findings: No erythema or rash.  Neurological:     Mental Status: He is alert and oriented to person, place, and time.     Sensory: No sensory deficit.     Motor: No weakness.  Psychiatric:        Behavior: Behavior normal.     ED Results / Procedures / Treatments   Labs (all labs ordered are listed, but only abnormal results are displayed) Labs Reviewed - No data to display  EKG None  Radiology DG Finger Middle Right  Result  Date: 04/15/2021 CLINICAL DATA:  Right long finger injury while working on a lawnmower. EXAM: RIGHT MIDDLE FINGER 2+V COMPARISON:  Right hand x-rays dated June 22, 2013. FINDINGS: Acute comminuted fracture of the third distal phalanx tuft with volar soft tissue swelling, soft tissue irregularity, in subcutaneous emphysema. There multiple superficial punctate radiopaque densities along the volar skin surface of the distal long finger. No dislocation. Joint spaces are preserved. Chronic smooth benign periosteal thickening along the volar margin of the third middle phalanx, unchanged since February 2015. Bone mineralization is normal. IMPRESSION: 1. Acute comminuted fracture of the third distal phalanx tuft with  associated soft tissue injury. 2. Multiple superficial punctate radiopaque densities along the volar skin surface of the distal long finger. Correlate for foreign body. Electronically Signed   By: Obie Dredge M.D.   On: 04/15/2021 13:22    Procedures .Marland KitchenLaceration Repair  Date/Time: 04/15/2021 2:32 PM Performed by: Jeannie Fend, PA-C Authorized by: Jeannie Fend, PA-C   Consent:    Consent obtained:  Verbal   Consent given by:  Patient   Risks discussed:  Infection, need for additional repair, pain, poor cosmetic result and poor wound healing   Alternatives discussed:  No treatment and delayed treatment Universal protocol:    Procedure explained and questions answered to patient or proxy's satisfaction: yes     Relevant documents present and verified: yes     Test results available: yes     Imaging studies available: yes     Required blood products, implants, devices, and special equipment available: yes     Site/side marked: yes     Immediately prior to procedure, a time out was called: yes     Patient identity confirmed:  Verbally with patient Anesthesia:    Anesthesia method:  Nerve block   Block location:  Right third MCP digital block   Block needle gauge:  25 G   Block anesthetic:  Lidocaine 1% w/o epi   Block technique:  Digital block   Block injection procedure:  Anatomic landmarks identified, anatomic landmarks palpated, introduced needle and incremental injection   Block outcome:  Anesthesia achieved Laceration details:    Location:  Finger   Finger location:  R long finger   Length (cm):  1.5   Depth (mm):  3 Pre-procedure details:    Preparation:  Patient was prepped and draped in usual sterile fashion and imaging obtained to evaluate for foreign bodies Exploration:    Imaging obtained: x-ray     Imaging outcome: foreign body noted     Wound exploration: wound explored through full range of motion and entire depth of wound visualized     Wound extent:  underlying fracture     Wound extent: no foreign bodies/material noted and no muscle damage noted     Contaminated: no   Treatment:    Area cleansed with:  Saline   Amount of cleaning:  Extensive   Irrigation solution:  Sterile saline Skin repair:    Repair method:  Sutures   Suture size:  4-0   Suture material:  Nylon   Suture technique:  Simple interrupted   Number of sutures:  6 Approximation:    Approximation:  Close Repair type:    Repair type:  Intermediate (Wound debrided) Post-procedure details:    Dressing:  Splint for protection   Procedure completion:  Tolerated well, no immediate complications   Medications Ordered in ED Medications  lidocaine (PF) (XYLOCAINE) 1 % injection 5 mL (5 mLs  Infiltration Given 04/15/21 1306)  ceFAZolin (ANCEF) IVPB 2g/100 mL premix (0 g Intravenous Stopped 04/15/21 1431)    ED Course  I have reviewed the triage vital signs and the nursing notes.  Pertinent labs & imaging results that were available during my care of the patient were reviewed by me and considered in my medical decision making (see chart for details).  Clinical Course as of 04/15/21 1442  Wed Apr 15, 2021  2925 45 year old male with right third finger injury after crushing with a lawnmower today. Patient is found to have a total of 3 small lacerations to the pad of the right third finger.  Wound was debrided, irrigated.  X-ray shows fracture of the distal finger.  Discussed with Earney Hamburg, PA-C with Ortho, recommends closure, splint, discharged on Keflex.  Ancef given in the ED today.  Patient to follow-up with Ortho on Friday or Monday. [LM]    Clinical Course User Index [LM] Alden Hipp   MDM Rules/Calculators/A&P                           Final Clinical Impression(s) / ED Diagnoses Final diagnoses:  Laceration of right middle finger without foreign body without damage to nail, initial encounter  Open nondisplaced fracture of distal phalanx of  right index finger, initial encounter    Rx / DC Orders ED Discharge Orders          Ordered    HYDROcodone-acetaminophen (NORCO/VICODIN) 5-325 MG tablet  Every 4 hours PRN        04/15/21 1440    cephALEXin (KEFLEX) 500 MG capsule  3 times daily        04/15/21 1440             Jeannie Fend, PA-C 04/15/21 1442    Sloan Leiter, DO 04/15/21 1604

## 2021-04-15 NOTE — ED Notes (Signed)
RN provided AVS using Teachback Method. Patient verbalizes understanding of Discharge Instructions. Opportunity for Questioning and Answers were provided by RN. Patient Discharged from ED ambulatory to home with Family.  

## 2021-04-15 NOTE — Discharge Instructions (Signed)
Keep wound clean and dry. Follow-up with hand Ortho, call today to schedule appointment for Friday or Monday for recheck.  Keep splint on unless removing to clean wound with mild soap on a Q-tip. Take Keflex as prescribed and complete the full course.  Take Norco as needed as prescribed for pain not controlled with Motrin Tylenol alone.

## 2021-04-15 NOTE — ED Triage Notes (Signed)
Reports "smashing" third finger while working on a lawnmower. Injury to finger pad, bleeding controlled with pressure dressing. TDAP updated 2020.

## 2022-07-21 ENCOUNTER — Other Ambulatory Visit: Payer: Self-pay

## 2022-07-21 ENCOUNTER — Emergency Department (HOSPITAL_BASED_OUTPATIENT_CLINIC_OR_DEPARTMENT_OTHER)
Admission: EM | Admit: 2022-07-21 | Discharge: 2022-07-21 | Disposition: A | Discharge status: Home / Self Care | Payer: 59 | Attending: Emergency Medicine | Admitting: Emergency Medicine

## 2022-07-21 ENCOUNTER — Emergency Department (HOSPITAL_BASED_OUTPATIENT_CLINIC_OR_DEPARTMENT_OTHER): Payer: 59

## 2022-07-21 DIAGNOSIS — Z87891 Personal history of nicotine dependence: Secondary | ICD-10-CM | POA: Diagnosis not present

## 2022-07-21 DIAGNOSIS — R319 Hematuria, unspecified: Secondary | ICD-10-CM | POA: Insufficient documentation

## 2022-07-21 DIAGNOSIS — R1032 Left lower quadrant pain: Secondary | ICD-10-CM | POA: Insufficient documentation

## 2022-07-21 LAB — CBC
HCT: 48.4 % (ref 39.0–52.0)
Hemoglobin: 16 g/dL (ref 13.0–17.0)
MCH: 29.3 pg (ref 26.0–34.0)
MCHC: 33.1 g/dL (ref 30.0–36.0)
MCV: 88.5 fL (ref 80.0–100.0)
Platelets: 245 10*3/uL (ref 150–400)
RBC: 5.47 MIL/uL (ref 4.22–5.81)
RDW: 12.9 % (ref 11.5–15.5)
WBC: 7.3 10*3/uL (ref 4.0–10.5)
nRBC: 0 % (ref 0.0–0.2)

## 2022-07-21 LAB — BASIC METABOLIC PANEL
Anion gap: 10 (ref 5–15)
BUN: 17 mg/dL (ref 6–20)
CO2: 25 mmol/L (ref 22–32)
Calcium: 10.1 mg/dL (ref 8.9–10.3)
Chloride: 104 mmol/L (ref 98–111)
Creatinine, Ser: 1.2 mg/dL (ref 0.61–1.24)
GFR, Estimated: >60 mL/min (ref >60)
Glucose, Bld: 140 mg/dL — ABNORMAL HIGH (ref 70–99)
Potassium: 3.8 mmol/L (ref 3.5–5.1)
Sodium: 139 mmol/L (ref 135–145)

## 2022-07-21 LAB — URINALYSIS, ROUTINE W REFLEX MICROSCOPIC: pH: 5.7 (ref 5.0–8.0)

## 2022-07-21 LAB — URINALYSIS, MICROSCOPIC (REFLEX)
RBC / HPF: >50 RBC/hpf (ref 0–5)
Squamous Epithelial / HPF: NONE SEEN /HPF (ref 0–5)

## 2022-07-21 MED ORDER — CEPHALEXIN 500 MG PO CAPS: 500.0000 mg | ORAL_CAPSULE | Freq: Three times a day (TID) | ORAL | 0 refills | Status: AC

## 2022-07-21 MED ORDER — HYDROMORPHONE HCL 1 MG/ML IJ SOLN
1.0000 mg | Freq: Once | INTRAMUSCULAR | Status: AC
  Administered 2022-07-21: 1 mg via INTRAVENOUS
  Filled 2022-07-21: qty 1

## 2022-07-21 MED ORDER — KETOROLAC TROMETHAMINE 15 MG/ML IJ SOLN
15.0000 mg | Freq: Once | INTRAMUSCULAR | Status: AC
  Administered 2022-07-21: 15 mg via INTRAVENOUS
  Filled 2022-07-21: qty 1

## 2022-07-21 MED ORDER — SODIUM CHLORIDE 0.9 % IV SOLN
2.0000 g | Freq: Once | INTRAVENOUS | Status: AC
  Administered 2022-07-21: 2 g via INTRAVENOUS
  Filled 2022-07-21: qty 20

## 2022-07-21 MED ORDER — IOHEXOL 350 MG/ML SOLN
100.0000 mL | Freq: Once | INTRAVENOUS | Status: AC | PRN
  Administered 2022-07-21: 100 mL via INTRAVENOUS

## 2022-07-21 MED ORDER — OXYCODONE HCL 5 MG PO TABS: 5.0000 mg | ORAL_TABLET | ORAL | 0 refills | Status: DC | PRN

## 2022-07-21 MED ORDER — TRAMADOL HCL 50 MG PO TABS: 50.0000 mg | ORAL_TABLET | Freq: Four times a day (QID) | ORAL | 0 refills | Status: DC | PRN

## 2022-07-21 MED ORDER — SODIUM CHLORIDE 0.9 % IV BOLUS
1000.0000 mL | Freq: Once | INTRAVENOUS | Status: AC
  Administered 2022-07-21: 1000 mL via INTRAVENOUS

## 2022-07-21 NOTE — ED Provider Notes (Addendum)
[facility] Emergency Department Provider Note MRN:  [id number]  Arrival date & time: 07/21/22     Chief Complaint   Abdominal Pain   History of Present Illness   Manuel Taylor is a 47 y.o. year-old male with no pertinent past medical history presenting to the ED with chief complaint of abdominal pain.  Left lower quadrant abdominal pain this evening with pain radiating down to the penis.  Hematuria this evening as well.  Pain is severe.  Review of Systems  A thorough review of systems was obtained and all systems are negative except as noted in the HPI and PMH.   Patient's Health History   No past medical history on file.  Past Surgical History:  Procedure Laterality Date   AMPUTATION Left 08/07/2018   Procedure: AMPUTATION DIGIT;  Surgeon: Dayna Barker, MD;  Location: [place];  Service: Plastics;  Laterality: Left;    No family history on file.  Social History   Socioeconomic History   Marital status: Married    Spouse name: Not on file   Number of children: Not on file   Years of education: Not on file   Highest education level: Not on file  Occupational History   Not on file  Tobacco Use   Smoking status: Former   Smokeless tobacco: Never  Substance and Sexual Activity   Alcohol use: Not on file    Comment: social   Drug use: Never   Sexual activity: Not on file  Other Topics Concern   Not on file  Social History Narrative   Not on file   Social Determinants of Health   Financial Resource Strain: Not on file  Food Insecurity: Not on file  Transportation Needs: Not on file  Physical Activity: Not on file  Stress: Not on file  Social Connections: Not on file  Intimate Partner Violence: Not on file     Physical Exam   Vitals:   07/21/22 0206 07/21/22 0505  BP: (!) 194/128 (!) 154/95  Pulse: 85 (!) 56  Resp: 18 18  Temp: 97.6 F (36.4 C) 98 F (36.7 C)  SpO2: 100% 96%    CONSTITUTIONAL: Well-appearing, moderate distress  due to pain NEURO/PSYCH:  Alert and oriented x 3, no focal deficits EYES:  eyes equal and reactive ENT/NECK:  no LAD, no JVD CARDIO: Regular rate, well-perfused, normal S1 and S2 PULM:  CTAB no wheezing or rhonchi GI/GU:  non-distended, non-tender MSK/SPINE:  No gross deformities, no edema SKIN:  no rash, atraumatic   *Additional and/or pertinent findings included in MDM below  Diagnostic and Interventional Summary    EKG Interpretation  Date/Time:    Ventricular Rate:    PR Interval:    QRS Duration:   QT Interval:    QTC Calculation:   R Axis:     Text Interpretation:         Labs Reviewed  BASIC METABOLIC PANEL - Abnormal; Notable for the following components:      Result Value   Glucose, Bld 140 (*)    All other components within normal limits  URINALYSIS, ROUTINE W REFLEX MICROSCOPIC - Abnormal; Notable for the following components:   Color, Urine RED (*)    APPearance HAZY (*)    Glucose, UA   (*)    Value: TEST NOT REPORTED DUE TO COLOR INTERFERENCE OF URINE PIGMENT   Hgb urine dipstick   (*)    Value: TEST NOT REPORTED DUE TO COLOR INTERFERENCE OF  URINE PIGMENT   Bilirubin Urine   (*)    Value: TEST NOT REPORTED DUE TO COLOR INTERFERENCE OF URINE PIGMENT   Ketones, ur   (*)    Value: TEST NOT REPORTED DUE TO COLOR INTERFERENCE OF URINE PIGMENT   Protein, ur   (*)    Value: TEST NOT REPORTED DUE TO COLOR INTERFERENCE OF URINE PIGMENT   Nitrite   (*)    Value: TEST NOT REPORTED DUE TO COLOR INTERFERENCE OF URINE PIGMENT   Leukocytes,Ua   (*)    Value: TEST NOT REPORTED DUE TO COLOR INTERFERENCE OF URINE PIGMENT   All other components within normal limits  URINALYSIS, MICROSCOPIC (REFLEX) - Abnormal; Notable for the following components:   Bacteria, UA FEW (*)    All other components within normal limits  CBC    US SCROTUM W/DOPPLER  Final Result    CT Angio Chest/Abd/Pel for Dissection W and/or Wo Contrast  Final Result    CT Renal Stone Study   Final Result      Medications  cefTRIAXone (ROCEPHIN) 2 g in sodium chloride 0.9 % 100 mL IVPB (2 g Intravenous New Bag/Given 07/21/22 [id number])  HYDROmorphone (DILAUDID) injection 1 mg (1 mg Intravenous Given 07/21/22 0301)  sodium chloride 0.9 % bolus 1,000 mL (1,000 mLs Intravenous Bolus 07/21/22 0300)  ketorolac (TORADOL) 15 MG/ML injection 15 mg (15 mg Intravenous Given 07/21/22 0301)  HYDROmorphone (DILAUDID) injection 1 mg (1 mg Intravenous Given 07/21/22 0502)  iohexol (OMNIPAQUE) 350 MG/ML injection 100 mL (100 mLs Intravenous Contrast Given 07/21/22 0511)     Procedures  /  Critical Care Procedures  ED Course and Medical Decision Making  Initial Impression and Ddx Suspicious for kidney stone given the history, providing pain control, awaiting CT to exclude other pathologies such as aortic aneurysm, diverticulitis  Past medical/surgical history that increases complexity of ED encounter: None  Interpretation of Diagnostics I personally reviewed the laboratory assessment and my interpretation is as follows: No significant blood count or electrolyte disturbance  CT imaging revealing possible recently passed kidney stone.  Liver masses of unclear significance discussed with patient who will follow-up with primary care doctor for MRI.  Patient Reassessment and Ultimate Disposition/Management Clinical Course as of 07/21/22 0704  Wed Jul 21, 2022  0500 Pain has returned in the left scrotum and left lower quadrant.  Pain despite no obvious source of continued pain on the CT scan.  This broadens differential diagnosis to include aortic dissection, testicular torsion.  The left testicle seems enlarged. [MB]    Clinical Course User Index [MB] Maudie Flakes, MD     Patient feeling better after additional dose of Dilaudid.  CT dissection study is normal, no signs of testicular torsion on ultrasound.  No signs of emergent process, appropriate for discharge.  Patient management required  discussion with the following services or consulting groups:  None  Complexity of Problems Addressed Acute illness or injury that poses threat of life of bodily function  Additional Data Reviewed and Analyzed Further history obtained from: Further history from spouse/family member  Additional Factors Impacting ED Encounter Risk Prescriptions and Use of parenteral controlled substances  Barth Kirks. Sedonia Small, [place] [email or web address]  Final Clinical Impressions(s) / ED Diagnoses     ICD-10-CM   1. Left lower quadrant abdominal pain  R10.32       ED Discharge Orders          Ordered  oxyCODONE (ROXICODONE) 5 MG immediate release tablet  Every 4 hours PRN,   Status:  Discontinued        07/21/22 0638    cephALEXin (KEFLEX) 500 MG capsule  3 times daily        07/21/22 0638    traMADol (ULTRAM) 50 MG tablet  Every 6 hours PRN        07/21/22 0704             Discharge Instructions Discussed with and Provided to Patient:     Discharge Instructions      You were evaluated in the Emergency Department and after careful evaluation, we did not find any emergent condition requiring admission or further testing in the hospital.  Your exam/testing today was overall reassuring.  Suspect pain related to kidney stone.  You may also have a urinary tract infection.  Take the Keflex antibiotic as directed.  Use the tramadol as needed for pain.  Please return to the Emergency Department if you experience any worsening of your condition.  Thank you for allowing [place] to be a part of your care.        Maudie Flakes, MD 07/21/22 [id number]    Maudie Flakes, MD 07/21/22 [phone]

## 2022-07-21 NOTE — Discharge Instructions (Addendum)
You were evaluated in the Emergency Department and after careful evaluation, we did not find any emergent condition requiring admission or further testing in the hospital.  Your exam/testing today was overall reassuring.  Suspect pain related to kidney stone.  You may also have a urinary tract infection.  Take the Keflex antibiotic as directed.  Use the tramadol as needed for pain.  Please return to the Emergency Department if you experience any worsening of your condition.  Thank you for allowing [place] to be a part of your care.

## 2022-07-21 NOTE — ED Triage Notes (Signed)
Reports LLQ pain that radiates to penis. Also reports nausea and blood in urine.

## 2022-07-28 ENCOUNTER — Other Ambulatory Visit: Payer: Self-pay | Admitting: Physician Assistant

## 2022-07-28 DIAGNOSIS — R160 Hepatomegaly, not elsewhere classified: Secondary | ICD-10-CM

## 2022-08-24 ENCOUNTER — Ambulatory Visit
Admission: RE | Admit: 2022-08-24 | Discharge: 2022-08-24 | Disposition: A | Discharge status: Home / Self Care | Payer: 59 | Source: Ambulatory Visit | Attending: Physician Assistant | Admitting: Physician Assistant

## 2022-08-24 DIAGNOSIS — R160 Hepatomegaly, not elsewhere classified: Secondary | ICD-10-CM

## 2022-08-24 MED ORDER — GADOPICLENOL 0.5 MMOL/ML IV SOLN
10.0000 mL | Freq: Once | INTRAVENOUS | Status: AC | PRN
  Administered 2022-08-24: 10 mL via INTRAVENOUS

## 2023-03-04 ENCOUNTER — Other Ambulatory Visit: Payer: Self-pay | Admitting: Surgery

## 2023-03-04 DIAGNOSIS — K7689 Other specified diseases of liver: Secondary | ICD-10-CM

## 2023-04-21 ENCOUNTER — Ambulatory Visit
Admission: RE | Admit: 2023-04-21 | Discharge: 2023-04-21 | Disposition: A | Discharge status: Home / Self Care | Payer: 59 | Source: Ambulatory Visit | Attending: Surgery | Admitting: Surgery

## 2023-04-21 DIAGNOSIS — K7689 Other specified diseases of liver: Secondary | ICD-10-CM

## 2023-04-21 MED ORDER — GADOPICLENOL 0.5 MMOL/ML IV SOLN
10.0000 mL | Freq: Once | INTRAVENOUS | Status: AC | PRN
  Administered 2023-04-21: 10 mL via INTRAVENOUS

## 2024-01-05 ENCOUNTER — Encounter (HOSPITAL_BASED_OUTPATIENT_CLINIC_OR_DEPARTMENT_OTHER): Payer: Self-pay

## 2024-01-05 ENCOUNTER — Emergency Department (HOSPITAL_BASED_OUTPATIENT_CLINIC_OR_DEPARTMENT_OTHER)

## 2024-01-05 ENCOUNTER — Other Ambulatory Visit: Payer: Self-pay

## 2024-01-05 ENCOUNTER — Emergency Department (HOSPITAL_BASED_OUTPATIENT_CLINIC_OR_DEPARTMENT_OTHER)
Admission: EM | Admit: 2024-01-05 | Discharge: 2024-01-05 | Disposition: A | Discharge status: Home / Self Care | Attending: Emergency Medicine | Admitting: Emergency Medicine

## 2024-01-05 DIAGNOSIS — W19XXXA Unspecified fall, initial encounter: Secondary | ICD-10-CM | POA: Insufficient documentation

## 2024-01-05 DIAGNOSIS — M25522 Pain in left elbow: Secondary | ICD-10-CM | POA: Diagnosis present

## 2024-01-05 MED ORDER — METHOCARBAMOL 500 MG PO TABS: 1000.0000 mg | ORAL_TABLET | Freq: Three times a day (TID) | ORAL | 0 refills | Status: AC | PRN

## 2024-01-05 NOTE — ED Provider Notes (Signed)
 Manuel Taylor Provider Note   CSN: [id number] Arrival date & time: 01/05/24  1844     Patient presents with: Elbow Pain and Fall   Manuel Taylor is a 48 y.o. male.   Patient presents to the emergency department for evaluation of left elbow pain after an injury occurring today.  Patient states that his foot went through a foot bridge and he fell.  He is unsure exactly how he landed or how he hit his elbow but he had pain after the injury.  Hurts more to fully flex the elbow.  No significant swelling or deformity.  Family member has had elbow fracture and he wanted to be seen to ensure no broken bones.  No distal numbness or tingling.  He was able to flex and extend with some discomfort.       Prior to Admission medications   Medication Sig Start Date End Date Taking? Authorizing Provider  methocarbamol  (ROBAXIN ) 500 MG tablet Take 2 tablets (1,000 mg total) by mouth every 8 (eight) hours as needed for muscle spasms. 01/05/24  Yes Desiderio Chew, PA-C    Allergies: Oxycodone     Review of Systems  Updated Vital Signs BP (!) 160/96   Pulse 64   Temp 97.6 F (36.4 C) (Temporal)   Resp 20   Ht 5' 10 (1.778 m)   Wt 102.1 kg   SpO2 97%   BMI 32.28 kg/m   Physical Exam Vitals and nursing note reviewed.  Constitutional:      Appearance: He is well-developed.  HENT:     Head: Normocephalic and atraumatic.  Eyes:     Conjunctiva/sclera: Conjunctivae normal.  Cardiovascular:     Pulses: Normal pulses. No decreased pulses.  Musculoskeletal:        General: Tenderness present.     Left shoulder: No tenderness. Normal range of motion.     Left upper arm: No tenderness or bony tenderness.     Left elbow: No swelling. Normal range of motion. Tenderness (distal triceps tendon) present.     Left forearm: No tenderness or bony tenderness.     Left wrist: No tenderness. Normal range of motion.     Cervical back: Normal range of motion and  neck supple.     Right lower leg: No edema.     Left lower leg: No edema.  Skin:    General: Skin is warm and dry.  Neurological:     Mental Status: He is alert.     Sensory: No sensory deficit.     Comments: Motor, sensation, and vascular distal to the injury is fully intact.   Psychiatric:        Mood and Affect: Mood normal.     (all labs ordered are listed, but only abnormal results are displayed) Labs Reviewed - No data to display  EKG: None  Radiology: DG Elbow Complete Left Result Date: 01/05/2024 CLINICAL DATA:  Fall, pain. EXAM: LEFT ELBOW - COMPLETE 3+ VIEW COMPARISON:  None Available. FINDINGS: There is no evidence of fracture, dislocation, or joint effusion. There is no evidence of arthropathy or other focal bone abnormality. Soft tissues are unremarkable. IMPRESSION: Negative. Electronically Signed   By: Greig Pique M.D.   On: 01/05/2024 19:39   DG Forearm Left Result Date: 01/05/2024 CLINICAL DATA:  Left forearm pain, fall EXAM: LEFT FOREARM - 2 VIEW COMPARISON:  None Available. FINDINGS: There is no evidence of fracture or other focal bone lesions. Soft tissues  are unremarkable. IMPRESSION: Negative. Electronically Signed   By: Dorethia Molt M.D.   On: 01/05/2024 19:36     Procedures   Medications Ordered in the ED - No data to display  ED Course  Patient seen and examined. History obtained directly from patient. Work-up including labs, imaging, EKG ordered in triage, if performed, were reviewed.    Labs/EKG: None ordered  Imaging: X-ray of the elbow and forearm, agree negative.  Medications/Fluids: None ordered  Most recent vital signs reviewed and are as follows: BP (!) 160/96   Pulse 64   Temp 97.6 F (36.4 C) (Temporal)   Resp 20   Ht 5' 10 (1.778 m)   Wt 102.1 kg   SpO2 97%   BMI 32.28 kg/m   Initial impression: Elbow pain after injury, negative for fracture, distal circulation, motor, sensation intact  Home treatment plan: RICE protocol,  prescription Robaxin   Patient counseled on proper use of muscle relaxant medication.  They were told not to drink alcohol, drive any vehicle, or do any dangerous activities while taking this medication.  Patient verbalized understanding  Return instructions discussed with patient: New or worsening symptoms  Follow-up instructions discussed with patient: Orthopedics in 1 week, family member established with Dr. Camella.                                   Medical Decision Making Amount and/or Complexity of Data Reviewed Radiology: ordered.  Risk Prescription drug management.   Patient with left elbow pain after a fall.  Imaging negative.  Distal circulation, motor, sensation intact.  No point tenderness over the radial head.  No dislocation.  Routine basic care indicated at this point with follow-up as needed.     Final diagnoses:  Left elbow pain    ED Discharge Orders          Ordered    methocarbamol  (ROBAXIN ) 500 MG tablet  Every 8 hours PRN        01/05/24 2123               Desiderio Chew, PA-C 01/05/24 2128    Zackowski, Scott, MD 01/06/24 [phone]

## 2024-01-05 NOTE — ED Triage Notes (Signed)
 Pt reports mechanical fall earlier today, landing on L arm. C/o L forearm and elbow pain. ROM present but painful. No obvious deformity, skin intact.

## 2024-01-05 NOTE — ED Notes (Signed)
 Pt d/c instructions, medications, and follow-up care reviewed with pt. Pt verbalized understanding and had no further questions at time of d/c. Pt CA&Ox4, ambulatory, and in NAD at time of d/c

## 2024-01-05 NOTE — Discharge Instructions (Signed)
 Please read and follow all provided instructions.  Your diagnoses today include:  1. Left elbow pain    Tests performed today include: X-rays of the affected areas - do NOT show any broken bones Vital signs. See below for your results today.   Medications prescribed:  Robaxin  (methocarbamol ) - muscle relaxer medication  DO NOT drive or perform any activities that require you to be awake and alert because this medicine can make you drowsy.   Please use over-the-counter NSAID medications (ibuprofen, naproxen) or Tylenol  (acetaminophen ) as directed on the packaging for pain -- as long as you do not have any reasons avoid these medications. Reasons to avoid NSAID medications include: weak kidneys, a history of bleeding in your stomach or gut, or uncontrolled high blood pressure or previous heart attack. Reasons to avoid Tylenol  include: liver problems or ongoing alcohol use. Never take more than 4000mg  or 8 Extra strength Tylenol  in a 24 hour period.     Take any prescribed medications only as directed.  Home care instructions:  Follow any educational materials contained in this packet Follow R.I.C.E. Protocol: R - rest your injury  I  - use ice on injury without applying directly to skin C - compress injury with bandage or splint E - elevate the injury as much as possible  Follow-up instructions: Please follow-up with your primary care provider or your orthopedic physician (bone specialist) if you continue to have significant pain in 1 week. In this case you may have a more severe injury that requires further care.   Return instructions:  Please return if your fingers are numb or tingling, appear gray or blue, or you have severe pain (also elevate the arm and loosen splint or wrap if you were given one) Please return to the Emergency Department if you experience worsening symptoms.  Please return if you have any other emergent concerns.  Additional Information:  Your vital signs  today were: BP (!) 160/96   Pulse 64   Temp 97.6 F (36.4 C) (Temporal)   Resp 20   Ht 5' 10 (1.778 m)   Wt 102.1 kg   SpO2 97%   BMI 32.28 kg/m  If your blood pressure (BP) was elevated above 135/85 this visit, please have this repeated by your doctor within one month. --------------
